# Patient Record
Sex: Female | Born: 1965 | Race: Black or African American | Hispanic: No | Marital: Single | Smoking: Never smoker
Health system: Southern US, Community
[De-identification: ages and names within clinical notes are randomized; demographics above are authoritative.]

## PROBLEM LIST (undated history)

## (undated) ENCOUNTER — Emergency Department: Admission: EM | Payer: Self-pay | Source: Home / Self Care

## (undated) DIAGNOSIS — K219 Gastro-esophageal reflux disease without esophagitis: Secondary | ICD-10-CM

## (undated) DIAGNOSIS — M419 Scoliosis, unspecified: Secondary | ICD-10-CM

## (undated) HISTORY — DX: Gastro-esophageal reflux disease without esophagitis: K21.9

## (undated) HISTORY — DX: Scoliosis, unspecified: M41.9

## (undated) HISTORY — PX: CYST EXCISION: SHX5701

---

## 2021-04-02 ENCOUNTER — Other Ambulatory Visit: Payer: Self-pay

## 2021-04-02 ENCOUNTER — Encounter: Payer: Self-pay | Admitting: Internal Medicine

## 2021-04-02 ENCOUNTER — Ambulatory Visit (INDEPENDENT_AMBULATORY_CARE_PROVIDER_SITE_OTHER): Payer: Self-pay | Admitting: Internal Medicine

## 2021-04-02 VITALS — BP 128/81 | HR 54 | Temp 99.0°F | Ht 60.24 in | Wt 124.0 lb

## 2021-04-02 DIAGNOSIS — R001 Bradycardia, unspecified: Secondary | ICD-10-CM

## 2021-04-02 DIAGNOSIS — M1990 Unspecified osteoarthritis, unspecified site: Secondary | ICD-10-CM

## 2021-04-02 DIAGNOSIS — Z124 Encounter for screening for malignant neoplasm of cervix: Secondary | ICD-10-CM | POA: Insufficient documentation

## 2021-04-02 DIAGNOSIS — R079 Chest pain, unspecified: Secondary | ICD-10-CM

## 2021-04-02 DIAGNOSIS — R000 Tachycardia, unspecified: Secondary | ICD-10-CM

## 2021-04-02 DIAGNOSIS — Z13220 Encounter for screening for lipoid disorders: Secondary | ICD-10-CM

## 2021-04-02 DIAGNOSIS — K2100 Gastro-esophageal reflux disease with esophagitis, without bleeding: Secondary | ICD-10-CM

## 2021-04-02 DIAGNOSIS — Z1231 Encounter for screening mammogram for malignant neoplasm of breast: Secondary | ICD-10-CM

## 2021-04-02 DIAGNOSIS — Z1211 Encounter for screening for malignant neoplasm of colon: Secondary | ICD-10-CM

## 2021-04-02 DIAGNOSIS — Z13820 Encounter for screening for osteoporosis: Secondary | ICD-10-CM

## 2021-04-02 MED ORDER — OMEPRAZOLE 20 MG PO CPDR: 20.0000 mg | DELAYED_RELEASE_CAPSULE | Freq: Every day | ORAL | 3 refills | Status: DC

## 2021-04-02 MED ORDER — NAPROXEN 500 MG PO TABS: 500.0000 mg | ORAL_TABLET | Freq: Two times a day (BID) | ORAL | 1 refills | Status: DC

## 2021-04-02 NOTE — Progress Notes (Signed)
BP 128/81   Pulse (!) 54   Temp 99 F (37.2 C) (Oral)   Ht 5' 0.24" (1.53 m)   Wt 124 lb (56.2 kg)   LMP  (LMP Unknown)   SpO2 98%   BMI 24.03 kg/m    Subjective:    Patient ID: April Mosley, female    DOB: 03-28-1966, 55 y.o.   MRN: [id number]  Chief Complaint  Patient presents with  . New Patient (Initial Visit)    Patient son Rulon Eisenmenger states his mother is having B/L hand pain that keeps her up at night. She has a loss of appetite and struggles to eat, and also that when she get upset about something that her mid chest hurts, but not heart pain. Patient just moved her from [place] in April.    HPI: April Mosley is a 55 y.o. female  Pt is here to establish care, she moved here from [place], her son is with her, he is the interpreter. Never seen pcp in [place].   Gastroesophageal Reflux She complains of heartburn. She reports no abdominal pain, no belching, no chest pain, no choking, no coughing, no dysphagia, no early satiety, no globus sensation, no hoarse voice, no nausea, no sore throat, no stridor, no tooth decay, no water brash or no wheezing. pt had some stress at home  ,has had heartburnhas been taking advil for her hand pain. Chronicity: denies melena, no hematochezia.  Hand Pain  The incident occurred more than 1 week ago (< 1 hour in the morning , has morning stiffness in bil hands. no swelling). There was no injury mechanism. The quality of the pain is described as aching. The pain is at a severity of 4/10. The pain has been Intermittent since the incident. Pertinent negatives include no chest pain. She has tried NSAIDs for the symptoms. The treatment provided moderate relief.   Chief Complaint  Patient presents with  . New Patient (Initial Visit)    Patient son Rulon Eisenmenger states his mother is having B/L hand pain that keeps her up at night. She has a loss of appetite and struggles to eat, and also that when she get upset about something that her mid chest hurts, but not heart pain.  Patient just moved her from [place] in April.    Relevant past medical, surgical, family and social history reviewed and updated as indicated. Interim medical history since our last visit reviewed. Allergies and medications reviewed and updated.  Review of Systems  HENT:  Negative for hoarse voice and sore throat.   Respiratory:  Negative for cough, choking and wheezing.   Cardiovascular:  Negative for chest pain.  Gastrointestinal:  Positive for heartburn. Negative for abdominal pain, dysphagia and nausea.   Per HPI unless specifically indicated above     Objective:    BP 128/81   Pulse (!) 54   Temp 99 F (37.2 C) (Oral)   Ht 5' 0.24" (1.53 m)   Wt 124 lb (56.2 kg)   LMP  (LMP Unknown)   SpO2 98%   BMI 24.03 kg/m   Wt Readings from Last 3 Encounters:  04/02/21 124 lb (56.2 kg)    Physical Exam Vitals and nursing note reviewed.  Constitutional:      General: She is not in acute distress.    Appearance: Normal appearance. She is not ill-appearing or diaphoretic.  Eyes:     Conjunctiva/sclera: Conjunctivae normal.  Cardiovascular:     Rate and Rhythm: Regular rhythm. Bradycardia present.  Heart sounds: No murmur heard.   No friction rub. No gallop.  Pulmonary:     Effort: No respiratory distress.     Breath sounds: No stridor. No wheezing or rhonchi.  Abdominal:     General: Abdomen is flat. Bowel sounds are normal. There is no distension.     Palpations: Abdomen is soft. There is no mass.     Tenderness: There is no abdominal tenderness. There is no guarding.  Musculoskeletal:        General: No swelling, tenderness, deformity or signs of injury.  Skin:    General: Skin is warm and dry.     Coloration: Skin is not jaundiced or pale.     Findings: No bruising or erythema.  Neurological:     Mental Status: She is alert.  Psychiatric:        Mood and Affect: Mood normal.        Behavior: Behavior normal.        Thought Content: Thought content normal.   No  results found for this or any previous visit.      Current Outpatient Medications:  .  omeprazole (PRILOSEC) 20 MG capsule, Take 1 capsule (20 mg total) by mouth daily., Disp: 30 capsule, Rfl: 3    Assessment & Plan:  GERD : start pt on a PPI  patient advised to avoid laying down soon after his meals. He took a 2 hours between dinner and bedtime. Avoid spicy food and triggers that he knows food wise that worsen his acid reflux. Patient verbalized understanding of the above. Lifestyle modifications as above discussed with patient.   2. Bradcardia ? Sec to heart block vs CAD given non specific ST - T changes inleads V1 - v4  Check TSH / Ft4 as pt has bradycardia.   3. Hand pain will check labs including arthritis wu ? sec RA vs Gout CHeck RF/ ANA / uric acid.  check xrays of swollen joints. consider referral to rheumatology pt takes pain meds for such avodi NSAIDs given chest pain      Problem List Items Addressed This Visit       Digestive   Gastroesophageal reflux disease with esophagitis     Musculoskeletal and Integument   Arthritis - Primary   Relevant Orders   CBC with Differential/Platelet   Comprehensive metabolic panel   Rheumatoid factor   ANA   Uric acid   TSH   DG Hand Complete Left   DG Hand Complete Right     Other   Screen for colon cancer   Relevant Orders   Ambulatory referral to Gastroenterology   Encounter for screening mammogram for malignant neoplasm of breast   Relevant Orders   MM 3D SCREEN BREAST BILATERAL   Screening for cervical cancer   Relevant Orders   Ambulatory referral to Obstetrics / Gynecology   Screening for osteoporosis   Relevant Orders   DG Bone Density   Screening for lipid disorders   Relevant Orders   Lipid panel   Bradycardia   Relevant Orders   EKG 12-Lead (Completed)   Ambulatory referral to Cardiology   Chest pain   Relevant Orders   Ambulatory referral to Cardiology   Other Visit Diagnoses     Tachycardia             Orders Placed This Encounter  Procedures  . MM 3D SCREEN BREAST BILATERAL  . DG Bone Density  . DG Hand Complete Left  . DG Hand  Complete Right  . CBC with Differential/Platelet  . Comprehensive metabolic panel  . Rheumatoid factor  . ANA  . Uric acid  . TSH  . Lipid panel  . Ambulatory referral to Gastroenterology  . Ambulatory referral to Obstetrics / Gynecology  . Ambulatory referral to Cardiology  . EKG 12-Lead     Meds ordered this encounter  Medications  . DISCONTD: naproxen (NAPROSYN) 500 MG tablet    Sig: Take 1 tablet (500 mg total) by mouth 2 (two) times daily with a meal.    Dispense:  60 tablet    Refill:  1  . omeprazole (PRILOSEC) 20 MG capsule    Sig: Take 1 capsule (20 mg total) by mouth daily.    Dispense:  30 capsule    Refill:  3     Follow up plan: Return in about 2 weeks (around 04/16/2021).  Health Maintenance : Mammogram: never  , ordered today   Paps smear:never  , ordered today  DEXA:never  , ordered today  Cscope :never  , ordered today

## 2021-04-02 NOTE — Patient Instructions (Signed)
geGastroesophageal Reflux Disease, Adult Gastroesophageal reflux (GER) happens when acid from the stomach flows up into the tube that connects the mouth and the stomach (esophagus). Normally, food travels down the esophagus and stays in the stomach to be digested. However, when a person has GER, food and stomach acid sometimes move back up into the esophagus. If this becomes a more serious problem, the person may be diagnosed with a disease called gastroesophageal reflux disease (GERD). GERD occurs when the reflux: Happens often. Causes frequent or severe symptoms. Causes problems such as damage to the esophagus. When stomach acid comes in contact with the esophagus, the acid may cause inflammation in the esophagus. Over time, GERD may create small holes (ulcers) in the lining of the esophagus. What are the causes? This condition is caused by a problem with the muscle between the esophagus and the stomach (lower esophageal sphincter, or LES). Normally, the LES muscle closes after food passes through the esophagus to the stomach. When the LES is weakened or abnormal, it does not close properly, and that allows food and stomach acid to go back up into the esophagus. The LES can be weakened by certain dietary substances, medicines, and medical conditions, including: Tobacco use. Pregnancy. Having a hiatal hernia. Alcohol use. Certain foods and beverages, such as coffee, chocolate, onions, and peppermint. What increases the risk? You are more likely to develop this condition if you: Have an increased body weight. Have a connective tissue disorder. Take NSAIDs, such as ibuprofen. What are the signs or symptoms? Symptoms of this condition include: Heartburn. Difficult or painful swallowing and the feeling of having a lump in the throat. A bitter taste in the mouth. Bad breath and having a large amount of saliva. Having an upset or bloated stomach and belching. Chest pain. Different conditions can  cause chest pain. Make sure you see your health care provider if you experience chest pain. Shortness of breath or wheezing. Ongoing (chronic) cough or a nighttime cough. Wearing away of tooth enamel. Weight loss. How is this diagnosed? This condition may be diagnosed based on a medical history and a physical exam. To determine if you have mild or severe GERD, your health care provider may also monitor how you respond to treatment. You may also have tests, including: A test to examine your stomach and esophagus with a small camera (endoscopy). A test that measures the acidity level in your esophagus. A test that measures how much pressure is on your esophagus. A barium swallow or modified barium swallow test to show the shape, size, and functioning of your esophagus. How is this treated? Treatment for this condition may vary depending on how severe your symptoms are. Your health care provider may recommend: Changes to your diet. Medicine. Surgery. The goal of treatment is to help relieve your symptoms and to prevent complications. Follow these instructions at home: Eating and drinking  Follow a diet as recommended by your health care provider. This may involve avoiding foods and drinks such as: Coffee and tea, with or without caffeine. Drinks that contain alcohol. Energy drinks and sports drinks. Carbonated drinks or sodas. Chocolate and cocoa. Peppermint and mint flavorings. Garlic and onions. Horseradish. Spicy and acidic foods, including peppers, chili powder, curry powder, vinegar, hot sauces, and barbecue sauce. Citrus fruit juices and citrus fruits, such as oranges, lemons, and limes. Tomato-based foods, such as red sauce, chili, salsa, and pizza with red sauce. Fried and fatty foods, such as donuts, french fries, potato chips, and high-fat dressings.  High-fat meats, such as hot dogs and fatty cuts of red and white meats, such as rib eye steak, sausage, ham, and  bacon. High-fat dairy items, such as whole milk, butter, and cream cheese. Eat small, frequent meals instead of large meals. Avoid drinking large amounts of liquid with your meals. Avoid eating meals during the 2-3 hours before bedtime. Avoid lying down right after you eat. Do not exercise right after you eat. Lifestyle  Do not use any products that contain nicotine or tobacco. These products include cigarettes, chewing tobacco, and vaping devices, such as e-cigarettes. If you need help quitting, ask your health care provider. Try to reduce your stress by using methods such as yoga or meditation. If you need help reducing stress, ask your health care provider. If you are overweight, reduce your weight to an amount that is healthy for you. Ask your health care provider for guidance about a safe weight loss goal. General instructions Pay attention to any changes in your symptoms. Take over-the-counter and prescription medicines only as told by your health care provider. Do not take aspirin, ibuprofen, or other NSAIDs unless your health care provider told you to take these medicines. Wear loose-fitting clothing. Do not wear anything tight around your waist that causes pressure on your abdomen. Raise (elevate) the head of your bed about 6 inches (15 cm). You can use a wedge to do this. Avoid bending over if this makes your symptoms worse. Keep all follow-up visits. This is important. Contact a health care provider if: You have: New symptoms. Unexplained weight loss. Difficulty swallowing or it hurts to swallow. Wheezing or a persistent cough. A hoarse voice. Your symptoms do not improve with treatment. Get help right away if: You have sudden pain in your arms, neck, jaw, teeth, or back. You suddenly feel sweaty, dizzy, or light-headed. You have chest pain or shortness of breath. You vomit and the vomit is green, yellow, or black, or it looks like blood or coffee grounds. You faint. You  have stool that is red, bloody, or black. You cannot swallow, drink, or eat. These symptoms may represent a serious problem that is an emergency. Do not wait to see if the symptoms will go away. Get medical help right away. Call your local emergency services (911 in the [place] Do not drive yourself to the hospital. Summary Gastroesophageal reflux happens when acid from the stomach flows up into the esophagus. GERD is a disease in which the reflux happens often, causes frequent or severe symptoms, or causes problems such as damage to the esophagus. Treatment for this condition may vary depending on how severe your symptoms are. Your health care provider may recommend diet and lifestyle changes, medicine, or surgery. Contact a health care provider if you have new or worsening symptoms. Take over-the-counter and prescription medicines only as told by your health care provider. Do not take aspirin, ibuprofen, or other NSAIDs unless your health care provider told you to do so. Keep all follow-up visits as told by your health care provider. This is important. This information is not intended to replace advice given to you by your health care provider. Make sure you discuss any questions you have with your health care provider. Document Revised: 11/26/2019 Document Reviewed: 11/26/2019 Elsevier Patient Education  2022 [employer].

## 2021-04-04 LAB — COMPREHENSIVE METABOLIC PANEL
ALT: 16 IU/L (ref 0–32)
AST: 23 IU/L (ref 0–40)
Albumin/Globulin Ratio: 1.5 (ref 1.2–2.2)
Albumin: 4.7 g/dL (ref 3.8–4.9)
Alkaline Phosphatase: 90 IU/L (ref 44–121)
BUN/Creatinine Ratio: 32 — ABNORMAL HIGH (ref 9–23)
BUN: 16 mg/dL (ref 6–24)
Bilirubin Total: 0.6 mg/dL (ref 0.0–1.2)
CO2: 21 mmol/L (ref 20–29)
Calcium: 9.9 mg/dL (ref 8.7–10.2)
Chloride: 98 mmol/L (ref 96–106)
Creatinine, Ser: 0.5 mg/dL — ABNORMAL LOW (ref 0.57–1.00)
Globulin, Total: 3.2 g/dL (ref 1.5–4.5)
Glucose: 72 mg/dL (ref 70–99)
Potassium: 4.3 mmol/L (ref 3.5–5.2)
Sodium: 139 mmol/L (ref 134–144)
Total Protein: 7.9 g/dL (ref 6.0–8.5)
eGFR: 110 mL/min/{1.73_m2} (ref >59)

## 2021-04-04 LAB — CBC WITH DIFFERENTIAL/PLATELET
Basophils Absolute: 0 10*3/uL (ref 0.0–0.2)
Basos: 0 %
EOS (ABSOLUTE): 0.1 10*3/uL (ref 0.0–0.4)
Eos: 2 %
Hematocrit: 41.9 % (ref 34.0–46.6)
Hemoglobin: 13.4 g/dL (ref 11.1–15.9)
Immature Grans (Abs): 0 10*3/uL (ref 0.0–0.1)
Immature Granulocytes: 0 %
Lymphocytes Absolute: 2.1 10*3/uL (ref 0.7–3.1)
Lymphs: 41 %
MCH: 27.8 pg (ref 26.6–33.0)
MCHC: 32 g/dL (ref 31.5–35.7)
MCV: 87 fL (ref 79–97)
Monocytes Absolute: 0.5 10*3/uL (ref 0.1–0.9)
Monocytes: 10 %
Neutrophils Absolute: 2.4 10*3/uL (ref 1.4–7.0)
Neutrophils: 47 %
Platelets: 234 10*3/uL (ref 150–450)
RBC: 4.82 x10E6/uL (ref 3.77–5.28)
RDW: 13.1 % (ref 11.7–15.4)
WBC: 5.1 10*3/uL (ref 3.4–10.8)

## 2021-04-04 LAB — TSH: TSH: 1.35 u[IU]/mL (ref 0.450–4.500)

## 2021-04-04 LAB — ANA: Anti Nuclear Antibody (ANA): POSITIVE — AB

## 2021-04-04 LAB — URIC ACID: Uric Acid: 3.5 mg/dL (ref 3.0–7.2)

## 2021-04-04 LAB — RHEUMATOID FACTOR: Rheumatoid fact SerPl-aCnc: 11.9 IU/mL (ref <14.0)

## 2021-04-06 ENCOUNTER — Ambulatory Visit
Admission: RE | Admit: 2021-04-06 | Discharge: 2021-04-06 | Disposition: A | Discharge status: Home / Self Care | Payer: Self-pay | Source: Ambulatory Visit | Attending: Internal Medicine | Admitting: Internal Medicine

## 2021-04-06 ENCOUNTER — Other Ambulatory Visit: Payer: Self-pay

## 2021-04-06 ENCOUNTER — Ambulatory Visit
Admission: RE | Admit: 2021-04-06 | Discharge: 2021-04-06 | Disposition: A | Discharge status: Home / Self Care | Payer: Self-pay | Attending: Internal Medicine | Admitting: Internal Medicine

## 2021-04-06 DIAGNOSIS — M1990 Unspecified osteoarthritis, unspecified site: Secondary | ICD-10-CM

## 2021-04-06 NOTE — Progress Notes (Signed)
Called patient's son regarding abnl labs, he is his who is the patient's POA and interpreter secondary to patient not speaking [place].  Given information on additional labs that will be ordered when she returns and the need for x-rays given a positive ANA. Pt hasnt had xrays of her hands yet,  We will check dsDNA and most likely needs to see rheumatology for further work-up and management.

## 2021-04-06 NOTE — Addendum Note (Signed)
Addended by: Loura Pardon on: 04/06/2021 11:10 AM   Modules accepted: Orders

## 2021-04-08 ENCOUNTER — Other Ambulatory Visit: Payer: Self-pay

## 2021-04-08 DIAGNOSIS — Z1211 Encounter for screening for malignant neoplasm of colon: Secondary | ICD-10-CM

## 2021-04-08 MED ORDER — PEG 3350-KCL-NA BICARB-NACL 420 G PO SOLR: 4000.0000 mL | Freq: Once | ORAL | 0 refills | Status: AC

## 2021-04-08 NOTE — Progress Notes (Signed)
Gastroenterology Pre-Procedure Review  Request Date: 04/28/21 Requesting Physician: Dr. Tobi Bastos  PATIENT REVIEW QUESTIONS: The patient responded to the following health history questions as indicated:    1. Are you having any GI issues? no 2. Do you have a personal history of Polyps? no 3. Do you have a family history of Colon Cancer or Polyps? no 4. Diabetes Mellitus? no 5. Joint replacements in the past 12 months?no 6. Major health problems in the past 3 months?no 7. Any artificial heart valves, MVP, or defibrillator?no    MEDICATIONS & ALLERGIES:    Patient reports the following regarding taking any anticoagulation/antiplatelet therapy:   Plavix, Coumadin, Eliquis, Xarelto, Lovenox, Pradaxa, Brilinta, or Effient? no Aspirin? no  Patient confirms/reports the following medications:  Current Outpatient Medications  Medication Sig Dispense Refill   omeprazole (PRILOSEC) 20 MG capsule Take 1 capsule (20 mg total) by mouth daily. 30 capsule 3   No current facility-administered medications for this visit.    Patient confirms/reports the following allergies:  No Known Allergies  No orders of the defined types were placed in this encounter.   AUTHORIZATION INFORMATION Primary Insurance: 1D#: Group #:  Secondary Insurance: 1D#: Group #:  SCHEDULE INFORMATION: Date: 04/28/21 Time: Location: [facility]

## 2021-04-09 ENCOUNTER — Encounter: Payer: Self-pay | Admitting: Internal Medicine

## 2021-04-09 ENCOUNTER — Other Ambulatory Visit: Payer: Self-pay

## 2021-04-09 ENCOUNTER — Ambulatory Visit (INDEPENDENT_AMBULATORY_CARE_PROVIDER_SITE_OTHER): Payer: Self-pay | Admitting: Internal Medicine

## 2021-04-09 VITALS — BP 120/80 | HR 60 | Ht 60.0 in | Wt 127.0 lb

## 2021-04-09 DIAGNOSIS — R001 Bradycardia, unspecified: Secondary | ICD-10-CM

## 2021-04-09 DIAGNOSIS — R079 Chest pain, unspecified: Secondary | ICD-10-CM

## 2021-04-09 DIAGNOSIS — R0789 Other chest pain: Secondary | ICD-10-CM

## 2021-04-09 DIAGNOSIS — R002 Palpitations: Secondary | ICD-10-CM

## 2021-04-09 NOTE — Progress Notes (Signed)
New Outpatient Visit Date: 04/09/2021  Referring Provider: Loura Pardon, MD [address] [employer] [place],  [place] [address]  Chief Complaint: Chest pain  HPI:  April Mosley is a 55 y.o. female who is being seen today for the evaluation of bradycardia and chest pain at the request of Dr. Charlotta Newton. History is obtained with assistance of [place] interpreter via telephone. She has no significant past medical history.  He moved here from [place] 6-7 months ago.  She reports a long history of intermittent chest pain and palpitations that predate her move to [place].  She states that these symptoms usually occur when she is stressed, particularly when dealing with her daughter.  Episodes last a few minutes and resolve spontaneously.  She cannot characterize the pain further.  It is substernal without radiation.  She denies shortness of breath, lightheadedness, nausea, and diaphoresis.  She does not have any exertional symptoms.  She denies a history of heart disease and prior heart testing.  --------------------------------------------------------------------------------------------------  Cardiovascular History & Procedures: Cardiovascular Problems: Chest pain Palpitations Bradycardia  Risk Factors: None  Cath/PCI: None  CV Surgery: None  EP Procedures and Devices: None  Non-Invasive Evaluation(s): None  Recent CV Pertinent Labs: Lab Results  Component Value Date   K 4.3 04/02/2021   BUN 16 04/02/2021   CREATININE 0.50 (L) 04/02/2021    --------------------------------------------------------------------------------------------------  Past Medical History: None  Past Surgical History: None  Medications: None  Allergies: Patient has no known allergies.  Social History   Tobacco Use   Smoking status: Never   Smokeless tobacco: Never  Vaping Use   Vaping Use: Never used  Substance Use Topics   Alcohol use: Not Currently   Drug use: Never    Family History: She reports her parents and  siblings do not have any medical problems.  She specifically denies a family history of heart disease, hypertension, hyperlipidemia, diabetes, and cancer.  Review of Systems: A 12-system review of systems was performed and was negative except as noted in the HPI.  --------------------------------------------------------------------------------------------------  Physical Exam: BP 120/80 (BP Location: Right Arm, Patient Position: Sitting, Cuff Size: Normal)   Pulse 60   Ht 5' (1.524 m)   Wt 127 lb (57.6 kg)   LMP  (LMP Unknown)   SpO2 98%   BMI 24.80 kg/m   General:  NAD.  Accompanied by her daughter-in-law HEENT: No conjunctival pallor or scleral icterus. Facemask in place. Neck: Supple without lymphadenopathy, thyromegaly, JVD, or HJR. No carotid bruit. Lungs: Normal work of breathing. Clear to auscultation bilaterally without wheezes or crackles. Heart: Regular rate and rhythm without murmurs, rubs, or gallops. Non-displaced PMI. Abd: Bowel sounds present. Soft, NT/ND without hepatosplenomegaly Ext: No lower extremity edema. Radial, PT, and DP pulses are 2+ bilaterally Skin: Warm and dry without rash. Neuro: CNIII-XII intact. Strength and fine-touch sensation intact in upper and lower extremities bilaterally. Psych: Normal mood and affect.  EKG:  Normal sinus rhythm without abnormality.  Lab Results  Component Value Date   WBC 5.1 04/02/2021   HGB 13.4 04/02/2021   HCT 41.9 04/02/2021   MCV 87 04/02/2021   PLT 234 04/02/2021    Lab Results  Component Value Date   NA 139 04/02/2021   K 4.3 04/02/2021   CL 98 04/02/2021   CO2 21 04/02/2021   BUN 16 04/02/2021   CREATININE 0.50 (L) 04/02/2021   GLUCOSE 72 04/02/2021   ALT 16 04/02/2021   Lab Results  Component Value Date   TSH 1.350  04/02/2021    --------------------------------------------------------------------------------------------------  ASSESSMENT AND PLAN: Atypical chest pain and  palpitations: Symptoms only seem to occur during stressful situations, particularly when April Mosley is interacting with her daughter.  This is most consistent with anxiety, particularly given absence of exertional symptoms as well as cardiac risk factors.  We discussed the role for noninvasive ischemia testing and ambulatory cardiac monitoring but have agreed to defer this, as April Mosley is hopeful that her stress level will improve over the next month or two.  It would be worthwhile to obtain a fasting lipid panel with her next blood draw for further risk stratification.  Bradycardia: Recent EKG by Dr. Charlotta Newton showed sinus bradycardia.  Today's tracing is normal with a heart rate of 60 bpm.  Recent TSH was normal.  No further workup is recommended at this time.  Follow-up: Return to clinic in 2 months.  Yvonne Kendall, MD 04/10/2021 7:46 PM

## 2021-04-09 NOTE — Patient Instructions (Addendum)
Medication Instructions:   Your physician recommends that you continue on your current medications as directed. Please refer to the Current Medication list given to you today.  *If you need a refill on your cardiac medications before your next appointment, please call your pharmacy*   Lab Work:  None ordered  Testing/Procedures:  None ordered   Follow-Up: At [facility], you and your health needs are our priority.  As part of our continuing mission to provide you with exceptional heart care, we have created designated Provider Care Teams.  These Care Teams include your primary Cardiologist (physician) and Advanced Practice Providers (APPs -  Physician Assistants and Nurse Practitioners) who all work together to provide you with the care you need, when you need it.  We recommend signing up for the patient portal called "MyChart".  Sign up information is provided on this After Visit Summary.  MyChart is used to connect with patients for Virtual Visits (Telemedicine).  Patients are able to view lab/test results, encounter notes, upcoming appointments, etc.  Non-urgent messages can be sent to your provider as well.   To learn more about what you can do with MyChart, go to [email or web address].    Your next appointment:   2 month(s)  The format for your next appointment:   In Person  Provider:   You may see Dr. Okey Dupre or one of the following Advanced Practice Providers on your designated Care Team:   Nicolasa Ducking, NP Eula Listen, PA-C Cadence Fransico Michael, PA-C  Other instructions  Contact our office if you have any recurrent chest pain or if you choose to move forward with further testing

## 2021-04-10 ENCOUNTER — Encounter: Payer: Self-pay | Admitting: Internal Medicine

## 2021-04-10 DIAGNOSIS — R002 Palpitations: Secondary | ICD-10-CM | POA: Insufficient documentation

## 2021-04-17 ENCOUNTER — Other Ambulatory Visit: Payer: Self-pay

## 2021-04-17 ENCOUNTER — Ambulatory Visit (INDEPENDENT_AMBULATORY_CARE_PROVIDER_SITE_OTHER): Payer: Self-pay | Admitting: Internal Medicine

## 2021-04-17 ENCOUNTER — Encounter: Payer: Self-pay | Admitting: Internal Medicine

## 2021-04-17 VITALS — BP 134/82 | HR 51 | Temp 98.2°F | Ht 59.84 in | Wt 129.0 lb

## 2021-04-17 DIAGNOSIS — Z13220 Encounter for screening for lipoid disorders: Secondary | ICD-10-CM

## 2021-04-17 DIAGNOSIS — R001 Bradycardia, unspecified: Secondary | ICD-10-CM

## 2021-04-17 DIAGNOSIS — Z1231 Encounter for screening mammogram for malignant neoplasm of breast: Secondary | ICD-10-CM

## 2021-04-17 DIAGNOSIS — R768 Other specified abnormal immunological findings in serum: Secondary | ICD-10-CM

## 2021-04-17 DIAGNOSIS — M1990 Unspecified osteoarthritis, unspecified site: Secondary | ICD-10-CM

## 2021-04-17 DIAGNOSIS — Z13820 Encounter for screening for osteoporosis: Secondary | ICD-10-CM

## 2021-04-17 LAB — URINALYSIS, ROUTINE W REFLEX MICROSCOPIC
Bilirubin, UA: NEGATIVE
Glucose, UA: NEGATIVE
Ketones, UA: NEGATIVE
Leukocytes,UA: NEGATIVE
Nitrite, UA: NEGATIVE
Protein,UA: NEGATIVE
RBC, UA: NEGATIVE
Specific Gravity, UA: 1.01 (ref 1.005–1.030)
Urobilinogen, Ur: 0.2 mg/dL (ref 0.2–1.0)
pH, UA: 6 (ref 5.0–7.5)

## 2021-04-17 NOTE — Patient Instructions (Signed)
Please call to schedule your mammogram and/or bone density: °[facility]  °Address: [address], , [place] [address]  °Phone: [phone] ° °

## 2021-04-17 NOTE — Progress Notes (Signed)
BP 134/82   Pulse (!) 51   Temp 98.2 F (36.8 C) (Oral)   Ht 4' 11.84" (1.52 m)   Wt 129 lb (58.5 kg)   LMP  (LMP Unknown)   SpO2 98%   BMI 25.33 kg/m    Subjective:    Patient ID: April Mosley, female    DOB: 21-Nov-1965, 55 y.o.   MRN: [id number]  Chief Complaint  Patient presents with   arthritis    HPI: April Mosley is a 55 y.o. female  Pt is here for a follow up she has a +Ve ANA , no ho blood clots, no misscarriages, no ho Kidney, no malar rash, no lung issues- no SOB/ No cough, no abdominal pain   Arthritis Presents for follow-up visit. She reports no pain, stiffness, joint swelling or joint warmth. (Hnads in wrists and palms of the bil hands. She dneies pain in other big joints. )   Chief Complaint  Patient presents with   arthritis    Relevant past medical, surgical, family and social history reviewed and updated as indicated. Interim medical history since our last visit reviewed. Allergies and medications reviewed and updated.  Review of Systems  Musculoskeletal:  Positive for arthritis. Negative for joint swelling and stiffness.   Per HPI unless specifically indicated above     Objective:    BP 134/82   Pulse (!) 51   Temp 98.2 F (36.8 C) (Oral)   Ht 4' 11.84" (1.52 m)   Wt 129 lb (58.5 kg)   LMP  (LMP Unknown)   SpO2 98%   BMI 25.33 kg/m   Wt Readings from Last 3 Encounters:  04/17/21 129 lb (58.5 kg)  04/09/21 127 lb (57.6 kg)  04/02/21 124 lb (56.2 kg)    Physical Exam Vitals and nursing note reviewed.  Constitutional:      General: She is not in acute distress.    Appearance: Normal appearance. She is not ill-appearing or diaphoretic.  Eyes:     Conjunctiva/sclera: Conjunctivae normal.  Pulmonary:     Breath sounds: No rhonchi.  Abdominal:     General: Abdomen is flat. Bowel sounds are normal. There is no distension.     Palpations: Abdomen is soft. There is no mass.     Tenderness: There is no abdominal tenderness. There is no  guarding.  Skin:    General: Skin is warm and dry.     Coloration: Skin is not jaundiced.     Findings: No erythema.  Neurological:     Mental Status: She is alert.    Results for orders placed or performed in visit on 04/02/21  CBC with Differential/Platelet  Result Value Ref Range   WBC 5.1 3.4 - 10.8 x10E3/uL   RBC 4.82 3.77 - 5.28 x10E6/uL   Hemoglobin 13.4 11.1 - 15.9 g/dL   Hematocrit 41.9 34.0 - 46.6 %   MCV 87 79 - 97 fL   MCH 27.8 26.6 - 33.0 pg   MCHC 32.0 31.5 - 35.7 g/dL   RDW 13.1 11.7 - 15.4 %   Platelets 234 150 - 450 x10E3/uL   Neutrophils 47 Not Estab. %   Lymphs 41 Not Estab. %   Monocytes 10 Not Estab. %   Eos 2 Not Estab. %   Basos 0 Not Estab. %   Neutrophils Absolute 2.4 1.4 - 7.0 x10E3/uL   Lymphocytes Absolute 2.1 0.7 - 3.1 x10E3/uL   Monocytes Absolute 0.5 0.1 - 0.9 x10E3/uL   EOS (ABSOLUTE)  0.1 0.0 - 0.4 x10E3/uL   Basophils Absolute 0.0 0.0 - 0.2 x10E3/uL   Immature Granulocytes 0 Not Estab. %   Immature Grans (Abs) 0.0 0.0 - 0.1 x10E3/uL  Comprehensive metabolic panel  Result Value Ref Range   Glucose 72 70 - 99 mg/dL   BUN 16 6 - 24 mg/dL   Creatinine, Ser 0.50 (L) 0.57 - 1.00 mg/dL   eGFR 110 >59 mL/min/1.73   BUN/Creatinine Ratio 32 (H) 9 - 23   Sodium 139 134 - 144 mmol/L   Potassium 4.3 3.5 - 5.2 mmol/L   Chloride 98 96 - 106 mmol/L   CO2 21 20 - 29 mmol/L   Calcium 9.9 8.7 - 10.2 mg/dL   Total Protein 7.9 6.0 - 8.5 g/dL   Albumin 4.7 3.8 - 4.9 g/dL   Globulin, Total 3.2 1.5 - 4.5 g/dL   Albumin/Globulin Ratio 1.5 1.2 - 2.2   Bilirubin Total 0.6 0.0 - 1.2 mg/dL   Alkaline Phosphatase 90 44 - 121 IU/L   AST 23 0 - 40 IU/L   ALT 16 0 - 32 IU/L  Rheumatoid factor  Result Value Ref Range   Rhuematoid fact SerPl-aCnc 11.9 <14.0 IU/mL  ANA  Result Value Ref Range   Anti Nuclear Antibody (ANA) Positive (A) Negative  Uric acid  Result Value Ref Range   Uric Acid 3.5 3.0 - 7.2 mg/dL  TSH  Result Value Ref Range   TSH 1.350 0.450  - 4.500 uIU/mL       No current outpatient medications on file.    Assessment & Plan:   1/ Positive ANA will check confirmatory tests for ? Lupus Will refer to rheumatology  Xryas -ve no acute inflammation noted   2. Cscope to be perfrmed end of the month  3. Atypical chest pain : sees cards for such  To fu in 2 months for a revisit, ? Has anxiety recently moved from [place], to clal cards if recurs.  Has been placed on Ppis for ? GERD  4. GERD stable. Continue Ppis for such   Problem List Items Addressed This Visit   None    Orders Placed This Encounter  Procedures   ANA+ENA+DNA/DS+Antich+Centr   Urinalysis, Routine w reflex microscopic   Ambulatory referral to Rheumatology     No orders of the defined types were placed in this encounter.    Follow up plan: No follow-ups on file.

## 2021-04-20 ENCOUNTER — Telehealth: Payer: Self-pay | Admitting: Gastroenterology

## 2021-04-20 NOTE — Telephone Encounter (Signed)
Patient wants to cancel procedure. Clinical staff will follow up with patient. 

## 2021-04-22 LAB — ANA+ENA+DNA/DS+ANTICH+CENTR
ANA Titer 1: NEGATIVE
Anti JO-1: <0.2 AI (ref 0.0–0.9)
Centromere Ab Screen: <0.2 AI (ref 0.0–0.9)
Chromatin Ab SerPl-aCnc: 0.5 AI (ref 0.0–0.9)
ENA RNP Ab: 0.6 AI (ref 0.0–0.9)
ENA SM Ab Ser-aCnc: <0.2 AI (ref 0.0–0.9)
ENA SSA (RO) Ab: <0.2 AI (ref 0.0–0.9)
ENA SSB (LA) Ab: <0.2 AI (ref 0.0–0.9)
Scleroderma (Scl-70) (ENA) Antibody, IgG: <0.2 AI (ref 0.0–0.9)
dsDNA Ab: 1 IU/mL (ref 0–9)

## 2021-04-28 ENCOUNTER — Ambulatory Visit: Admission: RE | Admit: 2021-04-28 | Payer: Self-pay | Source: Home / Self Care | Admitting: Gastroenterology

## 2021-04-28 ENCOUNTER — Encounter: Admission: RE | Payer: Self-pay | Source: Home / Self Care

## 2021-04-28 SURGERY — COLONOSCOPY WITH PROPOFOL
Anesthesia: General

## 2021-06-19 ENCOUNTER — Encounter: Payer: Self-pay | Admitting: Nurse Practitioner

## 2021-06-19 ENCOUNTER — Ambulatory Visit (INDEPENDENT_AMBULATORY_CARE_PROVIDER_SITE_OTHER): Payer: Self-pay | Admitting: Nurse Practitioner

## 2021-06-19 ENCOUNTER — Other Ambulatory Visit: Payer: Self-pay

## 2021-06-19 VITALS — BP 114/78 | HR 57 | Ht 61.0 in | Wt 126.4 lb

## 2021-06-19 DIAGNOSIS — R0789 Other chest pain: Secondary | ICD-10-CM

## 2021-06-19 DIAGNOSIS — R001 Bradycardia, unspecified: Secondary | ICD-10-CM

## 2021-06-19 NOTE — Progress Notes (Signed)
Cardiology Office Note:    Date:  06/19/2021   ID:  April Mosley, DOB Jan 01, 1966, MRN [id number]  PCP:  April Pardon, MD   [facility] Providers Cardiologist:  Yvonne Kendall, MD     Referring MD: April Pardon, MD   Chief Complaint: 2 month follow-up chest pain  History of Present Illness:    April Mosley is a 56 y.o. female with a hx of GERD, bradycardia, and atypical chest pain.   She was referred by PCP for evaluation of sinus bradycardia and chest pain and evaluated by Dr. Okey Dupre on 04/09/21. She reported a long history of intermittent chest pain and palpitations that predate her move to [place]. Reported symptoms most often occurred with stress and would last a few minutes and resolve spontaneously. Her pain was substernal without radiation. No SOB, lightheadedness, nausea, and diaphoresis. Her symptoms were felt to be anxiety related and she deferred further cardiac testing. Her EKG revealed HR of 60 bpm with no ST/T wave abnormalities. She was advised to return in 2 months.   Today, she is here with her son who speaks [place] and is serving as her interpreter.  She lives with her son and his family and is not currently employed.  She denies chest pain, shortness of breath, lower extremity edema, fatigue, palpitations, melena, diaphoresis, weakness, presyncope, syncope, orthopnea, and PND.  When asked about the stressful situation that she described at previous office visit she states that that situation has improved.  Her son states it was his younger sister causing stress and she is better now.  The family goes on walks together and eats a healthy diet of mostly food native to her home country of [place].  She does not have asked specific concerns today and does not wish to pursue further testing.  Past Medical History:  Diagnosis Date   GERD (gastroesophageal reflux disease)    Scoliosis deformity of spine     Past Surgical History:  Procedure Laterality Date   CYST EXCISION Right     beneath right axilla    Current Medications: No outpatient medications have been marked as taking for the 06/19/21 encounter (Office Visit) with Levi Aland, NP.     Allergies:   Patient has no known allergies.   Social History   Socioeconomic History   Marital status: Single    Spouse name: Not on file   Number of children: Not on file   Years of education: Not on file   Highest education level: Not on file  Occupational History   Not on file  Tobacco Use   Smoking status: Never   Smokeless tobacco: Never  Vaping Use   Vaping Use: Never used  Substance and Sexual Activity   Alcohol use: Not Currently   Drug use: Never   Sexual activity: Not Currently  Other Topics Concern   Not on file  Social History Narrative   Not on file   Social Determinants of Health   Financial Resource Strain: Not on file  Food Insecurity: Not on file  Transportation Needs: Not on file  Physical Activity: Not on file  Stress: Not on file  Social Connections: Not on file     Family History: The patient's family history is negative for Heart disease.  ROS:   Please see the history of present illness. All other systems reviewed and are negative.  Labs/Other Studies Reviewed:    The following studies were reviewed today:   Recent Labs: 04/02/2021: ALT 16; BUN 16;  Creatinine, Ser 0.50; Hemoglobin 13.4; Platelets 234; Potassium 4.3; Sodium 139; TSH 1.350  Recent Lipid Panel No results found for: CHOL, TRIG, HDL, CHOLHDL, VLDL, LDLCALC, LDLDIRECT   Risk Assessment/Calculations:     Physical Exam:    VS:  BP 114/78 (BP Location: Left Arm)    Pulse (!) 57    Ht 5\' 1"  (1.549 m)    Wt 126 lb 6.4 oz (57.3 kg)    LMP  (LMP Unknown)    SpO2 96%    BMI 23.88 kg/m     Wt Readings from Last 3 Encounters:  06/19/21 126 lb 6.4 oz (57.3 kg)  04/17/21 129 lb (58.5 kg)  04/09/21 127 lb (57.6 kg)     GEN:  Well nourished, well developed in no acute distress HEENT: Normal NECK: No  JVD; No carotid bruits CARDIAC: RRR, no murmurs, rubs, gallops RESPIRATORY:  Clear to auscultation without rales, wheezing or rhonchi  ABDOMEN: Soft, non-tender, non-distended MUSCULOSKELETAL:  No edema; No deformity. 2+ pedal pulses, equal bilaterally SKIN: Warm and dry NEUROLOGIC:  Alert and oriented x 3 PSYCHIATRIC:  Normal affect   EKG:  EKG is not ordered today.    Diagnoses:    1. Atypical chest pain   2. Bradycardia    Assessment and Plan:     Atypical chest pain: She is not having any chest pain today and reports no problems with chest pain recently.  No evidence for further ischemia evaluation at this time. I encouraged her and her son to call back if she develops any chest pain and advised that there are initial noninvasive tests that we can do should she did develop chest pain again  Bradycardia: Heart rate is 57 today.  She denies dizziness, lightheadedness, presyncope, syncope. She does not measure this at home.  She does not follow a formal exercise program but frequently goes on walks with her family. She is not on any medication.  No indication for further testing.  Disposition: Follow-up as needed   Medication Adjustments/Labs and Tests Ordered: Current medicines are reviewed at length with the patient today.  Concerns regarding medicines are outlined above.  No orders of the defined types were placed in this encounter.  No orders of the defined types were placed in this encounter.   Patient Instructions  Medication Instructions:  No changes at this time.   *If you need a refill on your cardiac medications before your next appointment, please call your pharmacy*   Lab Work: None  If you have labs (blood work) drawn today and your tests are completely normal, you will receive your results only by: MyChart Message (if you have MyChart) OR A paper copy in the mail If you have any lab test that is abnormal or we need to change your treatment, we will call  you to review the results.   Testing/Procedures: None   Follow-Up: At [facility], you and your health needs are our priority.  As part of our continuing mission to provide you with exceptional heart care, we have created designated Provider Care Teams.  These Care Teams include your primary Cardiologist (physician) and Advanced Practice Providers (APPs -  Physician Assistants and Nurse Practitioners) who all work together to provide you with the care you need, when you need it.  We recommend signing up for the patient portal called "MyChart".  Sign up information is provided on this After Visit Summary.  MyChart is used to connect with patients for Virtual Visits (Telemedicine).  Patients  are able to view lab/test results, encounter notes, upcoming appointments, etc.  Non-urgent messages can be sent to your provider as well.   To learn more about what you can do with MyChart, go to [email or web address].    Your next appointment:   As needed   Signed, Levi AlandSwinyer, Elina Streng M, NP  06/19/2021 4:18 PM    [facility]

## 2021-06-19 NOTE — Patient Instructions (Signed)
Medication Instructions:  No changes at this time.   *If you need a refill on your cardiac medications before your next appointment, please call your pharmacy*   Lab Work: None  If you have labs (blood work) drawn today and your tests are completely normal, you will receive your results only by: MyChart Message (if you have MyChart) OR A paper copy in the mail If you have any lab test that is abnormal or we need to change your treatment, we will call you to review the results.   Testing/Procedures: None   Follow-Up: At [facility], you and your health needs are our priority.  As part of our continuing mission to provide you with exceptional heart care, we have created designated Provider Care Teams.  These Care Teams include your primary Cardiologist (physician) and Advanced Practice Providers (APPs -  Physician Assistants and Nurse Practitioners) who all work together to provide you with the care you need, when you need it.  We recommend signing up for the patient portal called "MyChart".  Sign up information is provided on this After Visit Summary.  MyChart is used to connect with patients for Virtual Visits (Telemedicine).  Patients are able to view lab/test results, encounter notes, upcoming appointments, etc.  Non-urgent messages can be sent to your provider as well.   To learn more about what you can do with MyChart, go to [email or web address].    Your next appointment:   As needed

## 2021-07-17 ENCOUNTER — Ambulatory Visit: Payer: Self-pay | Admitting: Internal Medicine

## 2021-07-17 NOTE — Progress Notes (Unsigned)
° ° °  GYNECOLOGY PROGRESS NOTE  Subjective:    Patient ID: April Mosley, female    DOB: April 07, 1966, 56 y.o.   MRN: [id number]  HPI  Patient is a 56 y.o. No obstetric history on file. female who presents for referral for cervical cancer screening.    {Common ambulatory SmartLinks:19316}  Review of Systems {ros; complete:30496}   Objective:   There were no vitals taken for this visit. There is no height or weight on file to calculate BMI. General appearance: {general exam:16600} Abdomen: {abdominal exam:16834} Pelvic: {pelvic exam:16852::"cervix normal in appearance","external genitalia normal","no adnexal masses or tenderness","no cervical motion tenderness","rectovaginal septum normal","uterus normal size, shape, and consistency","vagina normal without discharge"} Extremities: {extremity exam:5109} Neurologic: {neuro exam:17854}   Assessment:   No diagnosis found.   Plan:   There are no diagnoses linked to this encounter.

## 2021-07-20 ENCOUNTER — Ambulatory Visit: Payer: Self-pay | Admitting: Internal Medicine

## 2021-07-22 ENCOUNTER — Encounter: Payer: Self-pay | Admitting: Obstetrics and Gynecology

## 2021-07-22 NOTE — Progress Notes (Deleted)
Office Visit Note  Patient: April Mosley             Date of Birth: 02/10/1966           MRN: [id number]             PCP: Charlynne Cousins, MD Referring: Charlynne Cousins, MD Visit Date: 07/23/2021 Occupation: @GUAROCC @  Subjective:  No chief complaint on file.   History of Present Illness: Irish Kupka is a 56 y.o. female here for positive ANA with bilateral hand and wrist pain. Other labs for RA were negative. Xrays of both hands did not show significant arthritis changes.***   Activities of Daily Living:  Patient reports morning stiffness for *** {minute/hour:19697}.   Patient {ACTIONS;DENIES/REPORTS:21021675::"Denies"} nocturnal pain.  Difficulty dressing/grooming: {ACTIONS;DENIES/REPORTS:21021675::"Denies"} Difficulty climbing stairs: {ACTIONS;DENIES/REPORTS:21021675::"Denies"} Difficulty getting out of chair: {ACTIONS;DENIES/REPORTS:21021675::"Denies"} Difficulty using hands for taps, buttons, cutlery, and/or writing: {ACTIONS;DENIES/REPORTS:21021675::"Denies"}  No Rheumatology ROS completed.   PMFS History:  Patient Active Problem List   Diagnosis Date Noted   ANA positive 04/17/2021   Palpitations 04/10/2021   Arthritis 04/02/2021   Gastroesophageal reflux disease with esophagitis 04/02/2021   Screen for colon cancer 04/02/2021   Encounter for screening mammogram for malignant neoplasm of breast 04/02/2021   Screening for cervical cancer 04/02/2021   Screening for osteoporosis 04/02/2021   Screening for lipid disorders 04/02/2021   Bradycardia 04/02/2021   Chest pain 04/02/2021    Past Medical History:  Diagnosis Date   GERD (gastroesophageal reflux disease)    Scoliosis deformity of spine     Family History  Problem Relation Age of Onset   Heart disease Neg Hx    Past Surgical History:  Procedure Laterality Date   CYST EXCISION Right    beneath right axilla   Social History   Social History Narrative   Not on file   Immunization History  Administered  Date(s) Administered   MODERNA COVID-19 SARS-COV-2 PEDS BIVALENT BOOSTER 6Y-11Y 07/29/2020   Yellow Fever 07/29/2020     Objective: Vital Signs: LMP  (LMP Unknown)    Physical Exam   Musculoskeletal Exam: ***  CDAI Exam: CDAI Score: -- Patient Global: --; Provider Global: -- Swollen: --; Tender: -- Joint Exam 07/23/2021   No joint exam has been documented for this visit   There is currently no information documented on the homunculus. Go to the Rheumatology activity and complete the homunculus joint exam.  Investigation: No additional findings.  Imaging: No results found.  Recent Labs: Lab Results  Component Value Date   WBC 5.1 04/02/2021   HGB 13.4 04/02/2021   PLT 234 04/02/2021   NA 139 04/02/2021   K 4.3 04/02/2021   CL 98 04/02/2021   CO2 21 04/02/2021   GLUCOSE 72 04/02/2021   BUN 16 04/02/2021   CREATININE 0.50 (L) 04/02/2021   BILITOT 0.6 04/02/2021   ALKPHOS 90 04/02/2021   AST 23 04/02/2021   ALT 16 04/02/2021   PROT 7.9 04/02/2021   ALBUMIN 4.7 04/02/2021   CALCIUM 9.9 04/02/2021    Speciality Comments: No specialty comments available.  Procedures:  No procedures performed Allergies: Patient has no known allergies.   Assessment / Plan:     Visit Diagnoses: No diagnosis found.  Orders: No orders of the defined types were placed in this encounter.  No orders of the defined types were placed in this encounter.   Face-to-face time spent with patient was *** minutes. Greater than 50% of time was spent in counseling and coordination of care.  Follow-Up Instructions: No follow-ups on file.   Collier Salina, MD  Note - This record has been created using [employer].  Chart creation errors have been sought, but may not always  have been located. Such creation errors do not reflect on  the standard of medical care.

## 2021-07-23 ENCOUNTER — Ambulatory Visit: Payer: Self-pay | Admitting: Internal Medicine

## 2021-08-10 ENCOUNTER — Ambulatory Visit: Payer: Self-pay | Admitting: Internal Medicine

## 2021-11-03 NOTE — Progress Notes (Unsigned)
ANNUAL PREVENTATIVE CARE GYNECOLOGY  ENCOUNTER NOTE  Subjective:       April Mosley is a 56 y.o. No obstetric history on file. female here for a routine annual gynecologic exam. The patient {is/is not/has never been:13135} sexually active. The patient {is/is not:13135} taking hormone replacement therapy. {post-men bleed:13152::"Patient denies post-menopausal vaginal bleeding."} The patient wears seatbelts: {yes/no:311178}. The patient participates in regular exercise: {yes/no/not asked:9010}. Has the patient ever been transfused or tattooed?: {yes/no/not asked:9010}. The patient reports that there {is/is not:9024} domestic violence in her life.  Current complaints: 1.  ***    Gynecologic History No LMP recorded (lmp unknown). Patient is postmenopausal. Contraception: {method:5051} Last Pap: ***. Results were: {norm/abn:16337} Last mammogram: ***. Results were: {norm/abn:16337} Last Colonoscopy:  Last Dexa Scan:    Obstetric History OB History  No obstetric history on file.    Past Medical History:  Diagnosis Date   GERD (gastroesophageal reflux disease)    Scoliosis deformity of spine     Family History  Problem Relation Age of Onset   Heart disease Neg Hx     Past Surgical History:  Procedure Laterality Date   CYST EXCISION Right    beneath right axilla    Social History   Socioeconomic History   Marital status: Single    Spouse name: Not on file   Number of children: Not on file   Years of education: Not on file   Highest education level: Not on file  Occupational History   Not on file  Tobacco Use   Smoking status: Never   Smokeless tobacco: Never  Vaping Use   Vaping Use: Never used  Substance and Sexual Activity   Alcohol use: Not Currently   Drug use: Never   Sexual activity: Not Currently  Other Topics Concern   Not on file  Social History Narrative   Not on file   Social Determinants of Health   Financial Resource Strain: Not on file   Food Insecurity: Not on file  Transportation Needs: Not on file  Physical Activity: Not on file  Stress: Not on file  Social Connections: Not on file  Intimate Partner Violence: Not on file    No current outpatient medications on file prior to visit.   No current facility-administered medications on file prior to visit.    No Known Allergies    Review of Systems ROS Review of Systems - General ROS: negative for - chills, fatigue, fever, hot flashes, night sweats, weight gain or weight loss Psychological ROS: negative for - anxiety, decreased libido, depression, mood swings, physical abuse or sexual abuse Ophthalmic ROS: negative for - blurry vision, eye pain or loss of vision ENT ROS: negative for - headaches, hearing change, visual changes or vocal changes Allergy and Immunology ROS: negative for - hives, itchy/watery eyes or seasonal allergies Hematological and Lymphatic ROS: negative for - bleeding problems, bruising, swollen lymph nodes or weight loss Endocrine ROS: negative for - galactorrhea, hair pattern changes, hot flashes, malaise/lethargy, mood swings, palpitations, polydipsia/polyuria, skin changes, temperature intolerance or unexpected weight changes Breast ROS: negative for - new or changing breast lumps or nipple discharge Respiratory ROS: negative for - cough or shortness of breath Cardiovascular ROS: negative for - chest pain, irregular heartbeat, palpitations or shortness of breath Gastrointestinal ROS: no abdominal pain, change in bowel habits, or black or bloody stools Genito-Urinary ROS: no dysuria, trouble voiding, or hematuria Musculoskeletal ROS: negative for - joint pain or joint stiffness Neurological ROS: negative for - bowel  and bladder control changes Dermatological ROS: negative for rash and skin lesion changes   Objective:   LMP  (LMP Unknown)  CONSTITUTIONAL: Well-developed, well-nourished female in no acute distress.  PSYCHIATRIC: Normal mood  and affect. Normal behavior. Normal judgment and thought content. NEUROLGIC: Alert and oriented to person, place, and time. Normal muscle tone coordination. No cranial nerve deficit noted. HENT:  Normocephalic, atraumatic, External right and left ear normal. Oropharynx is clear and moist EYES: Conjunctivae and EOM are normal. Pupils are equal, round, and reactive to light. No scleral icterus.  NECK: Normal range of motion, supple, no masses.  Normal thyroid.  SKIN: Skin is warm and dry. No rash noted. Not diaphoretic. No erythema. No pallor. CARDIOVASCULAR: Normal heart rate noted, regular rhythm, no murmur. RESPIRATORY: Clear to auscultation bilaterally. Effort and breath sounds normal, no problems with respiration noted. BREASTS: Symmetric in size. No masses, skin changes, nipple drainage, or lymphadenopathy. ABDOMEN: Soft, normal bowel sounds, no distention noted.  No tenderness, rebound or guarding.  BLADDER: Normal PELVIC:  Bladder {:311640}  Urethra: {:311719}  Vulva: {:311722}  Vagina: {:311643}  Cervix: {:311644}  Uterus: {:311718}  Adnexa: {:311645}  RV: {Blank multiple:19196::"External Exam NormaI","No Rectal Masses","Normal Sphincter tone"}  MUSCULOSKELETAL: Normal range of motion. No tenderness.  No cyanosis, clubbing, or edema.  2+ distal pulses. LYMPHATIC: No Axillary, Supraclavicular, or Inguinal Adenopathy.   Labs: Lab Results  Component Value Date   WBC 5.1 04/02/2021   HGB 13.4 04/02/2021   HCT 41.9 04/02/2021   MCV 87 04/02/2021   PLT 234 04/02/2021    Lab Results  Component Value Date   CREATININE 0.50 (L) 04/02/2021   BUN 16 04/02/2021   NA 139 04/02/2021   K 4.3 04/02/2021   CL 98 04/02/2021   CO2 21 04/02/2021    Lab Results  Component Value Date   ALT 16 04/02/2021   AST 23 04/02/2021   ALKPHOS 90 04/02/2021   BILITOT 0.6 04/02/2021    No results found for: CHOL, HDL, LDLCALC, LDLDIRECT, TRIG, CHOLHDL  Lab Results  Component Value Date    TSH 1.350 04/02/2021    No results found for: HGBA1C   Assessment:   No diagnosis found.   Plan:  Pap: {Blank multiple:19196::"Pap, Reflex if ASCUS","Pap Co Test","GC/CT NAAT","Not needed","Not done"} Mammogram: {Blank multiple:19196::"***","Ordered","Not Ordered","Not Indicated"} Colon Screening:  {Blank multiple:19196::"***","Ordered","Not Ordered","Not Indicated"} Labs: {Blank multiple:19196::"Lipid 1","FBS","TSH","Hemoglobin A1C","Vit D Level""***"} Routine preventative health maintenance measures emphasized: {Blank multiple:19196::"Exercise/Diet/Weight control","Tobacco Warnings","Alcohol/Substance use risks","Stress Management","Peer Pressure Issues","Safe Sex"} COVID Vaccination status: Return to Clinic - 1 Year   Hildred Laser, MD [facility]

## 2021-11-04 ENCOUNTER — Ambulatory Visit (INDEPENDENT_AMBULATORY_CARE_PROVIDER_SITE_OTHER): Payer: Self-pay | Admitting: Obstetrics and Gynecology

## 2021-11-04 ENCOUNTER — Encounter: Payer: Self-pay | Admitting: Obstetrics and Gynecology

## 2021-11-04 VITALS — BP 120/70 | HR 60 | Resp 16 | Ht 61.0 in | Wt 131.7 lb

## 2021-11-04 DIAGNOSIS — N951 Menopausal and female climacteric states: Secondary | ICD-10-CM

## 2021-11-04 DIAGNOSIS — Z124 Encounter for screening for malignant neoplasm of cervix: Secondary | ICD-10-CM

## 2021-11-04 NOTE — Patient Instructions (Signed)

## 2021-11-12 LAB — IGP, COBASHPV16/18
HPV 16: NEGATIVE
HPV 18: NEGATIVE
HPV other hr types: NEGATIVE

## 2022-10-14 IMAGING — DX DG HAND COMPLETE 3+V*R*
3 series · 3 of 3 positions shown · non-contrast
Comparison: None.

CLINICAL DATA: Chronic bilateral hand pain without known injury.

EXAM:
RIGHT HAND - COMPLETE 3+ VIEW

[hand ap]
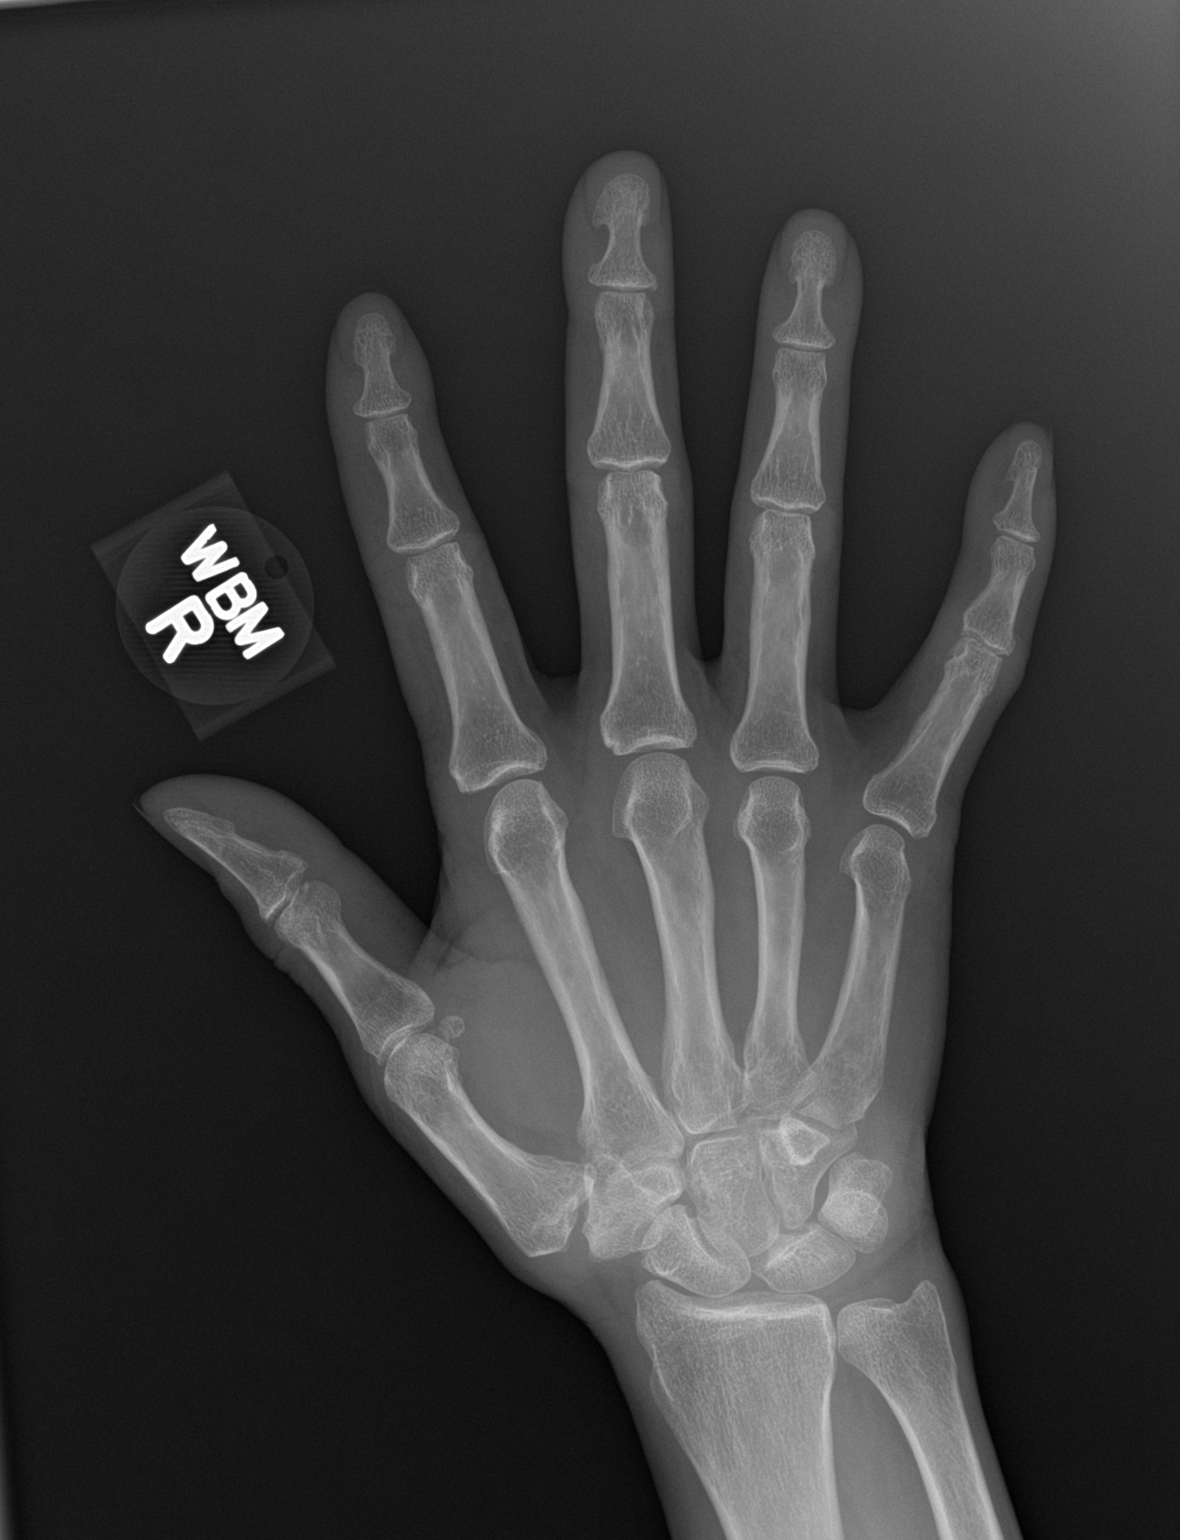

[hand obl]
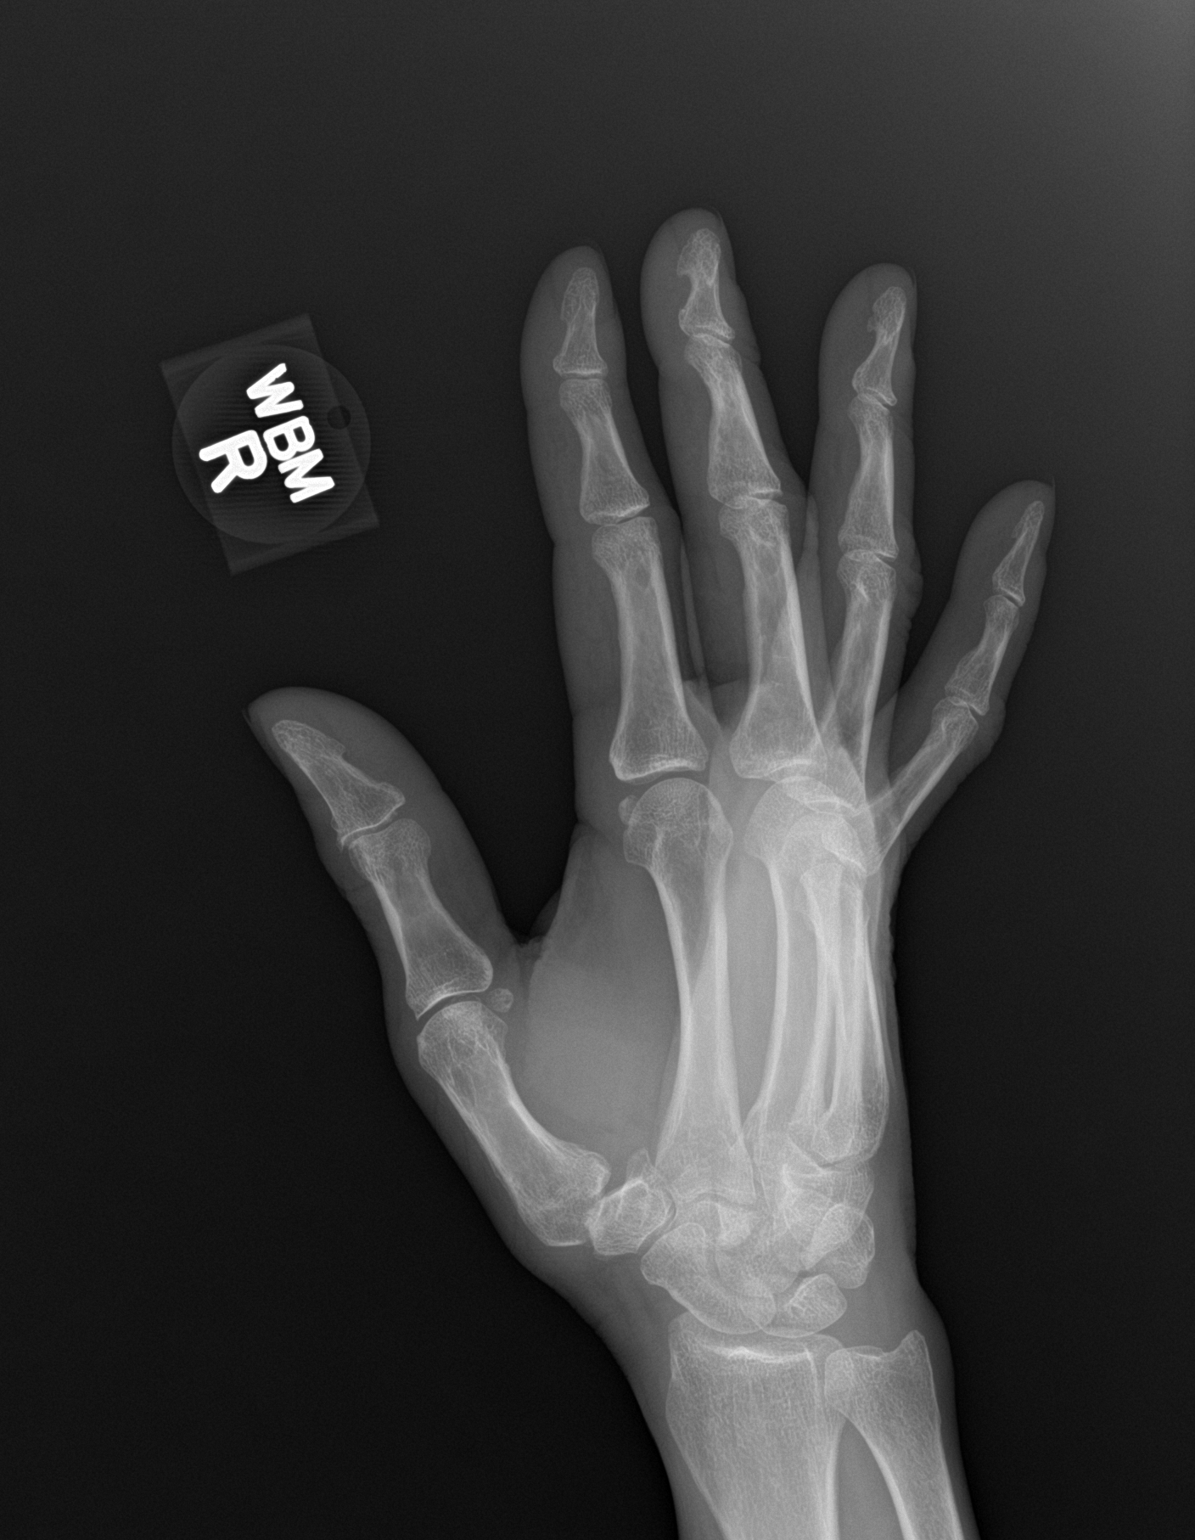

[hand lat]
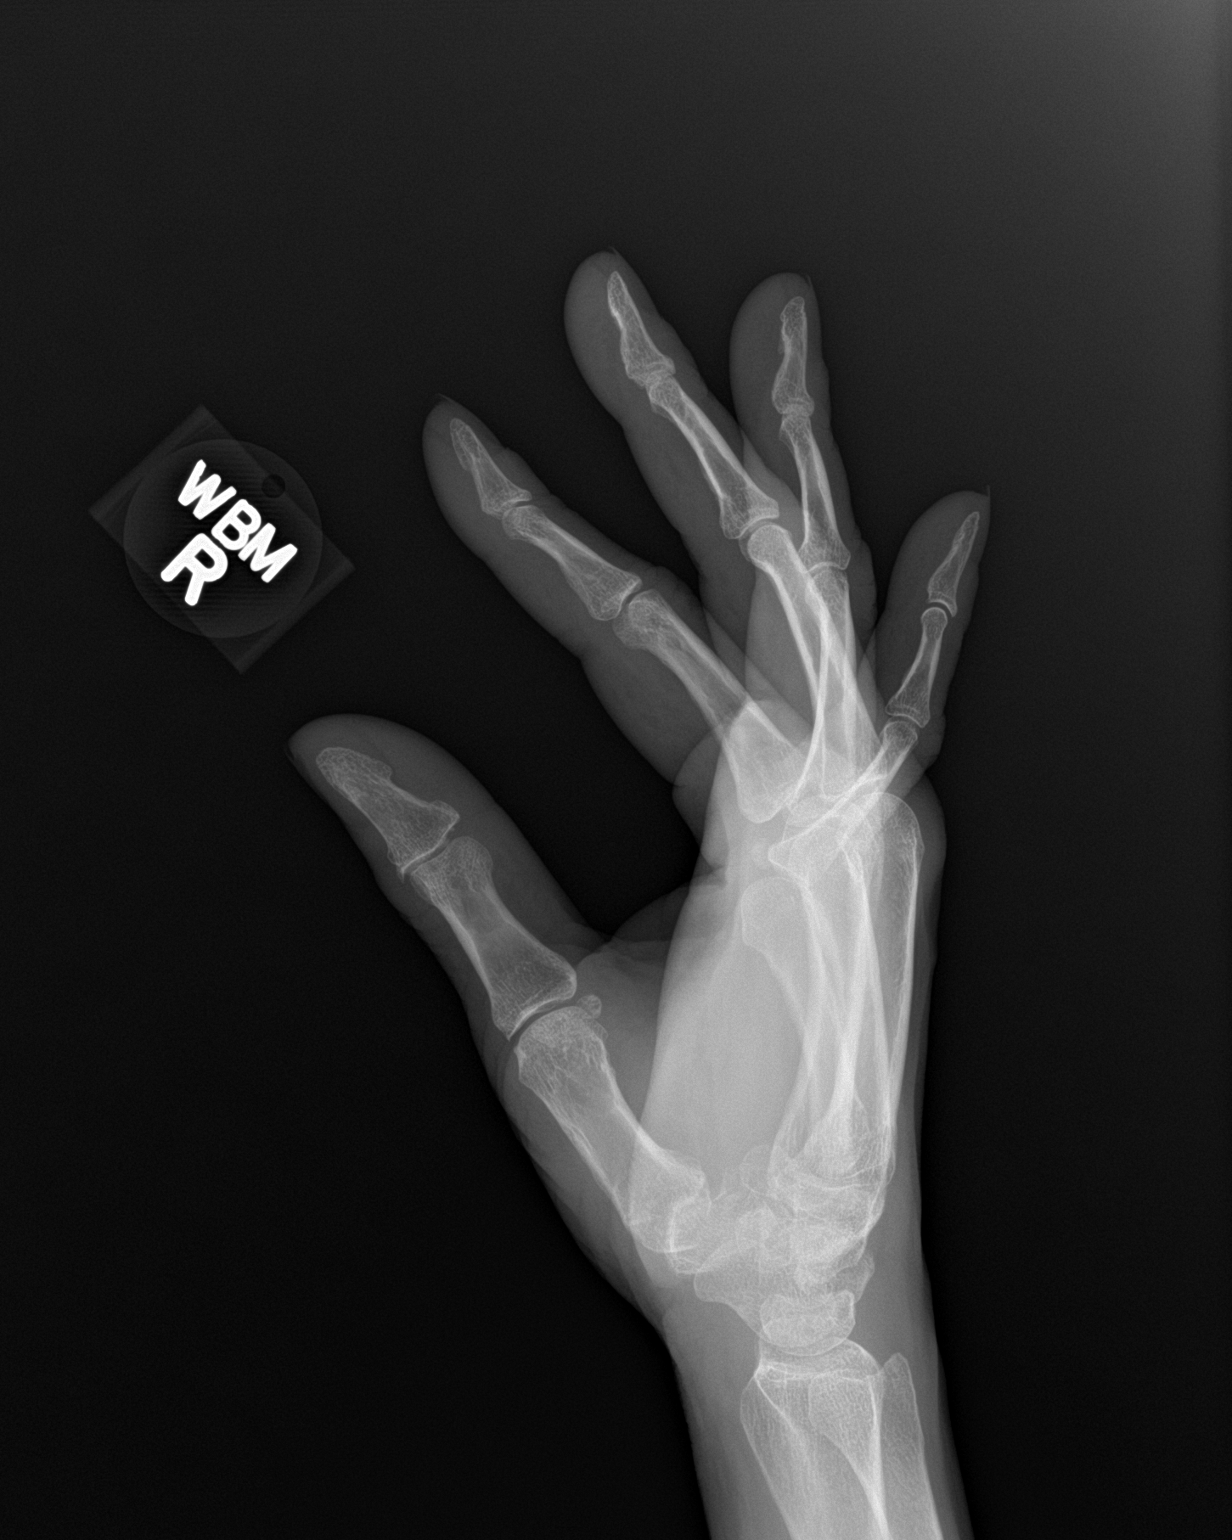

[3 of 3 positions shown; findings below may reference images not displayed]

FINDINGS: There is no evidence of fracture or dislocation. There is no
evidence of arthropathy or other focal bone abnormality. Soft
tissues are unremarkable.
IMPRESSION: Negative.

## 2022-10-14 IMAGING — DX DG HAND COMPLETE 3+V*L*
3 series · 3 of 3 positions shown · non-contrast
Comparison: None.

CLINICAL DATA: Chronic bilateral hand pain.  No known injury

EXAM:
LEFT HAND - COMPLETE 3+ VIEW

[hand ap]
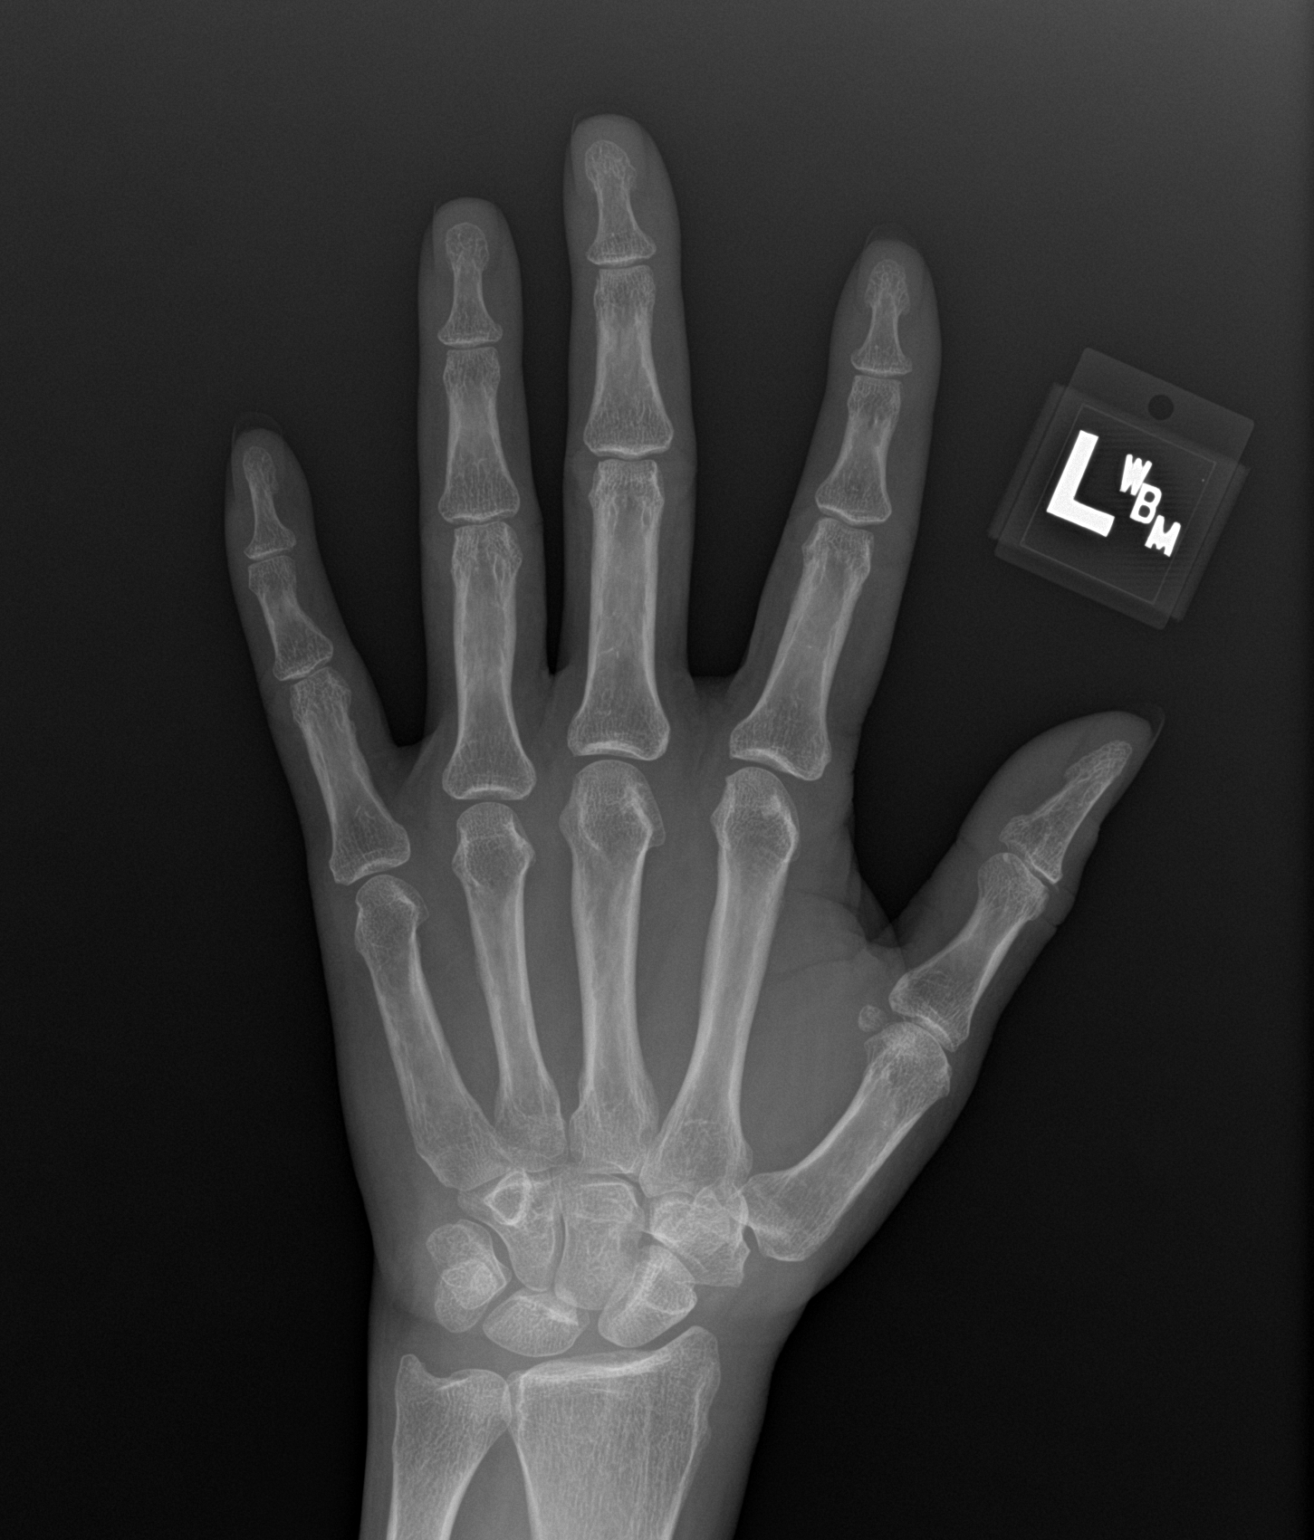

[hand obl]
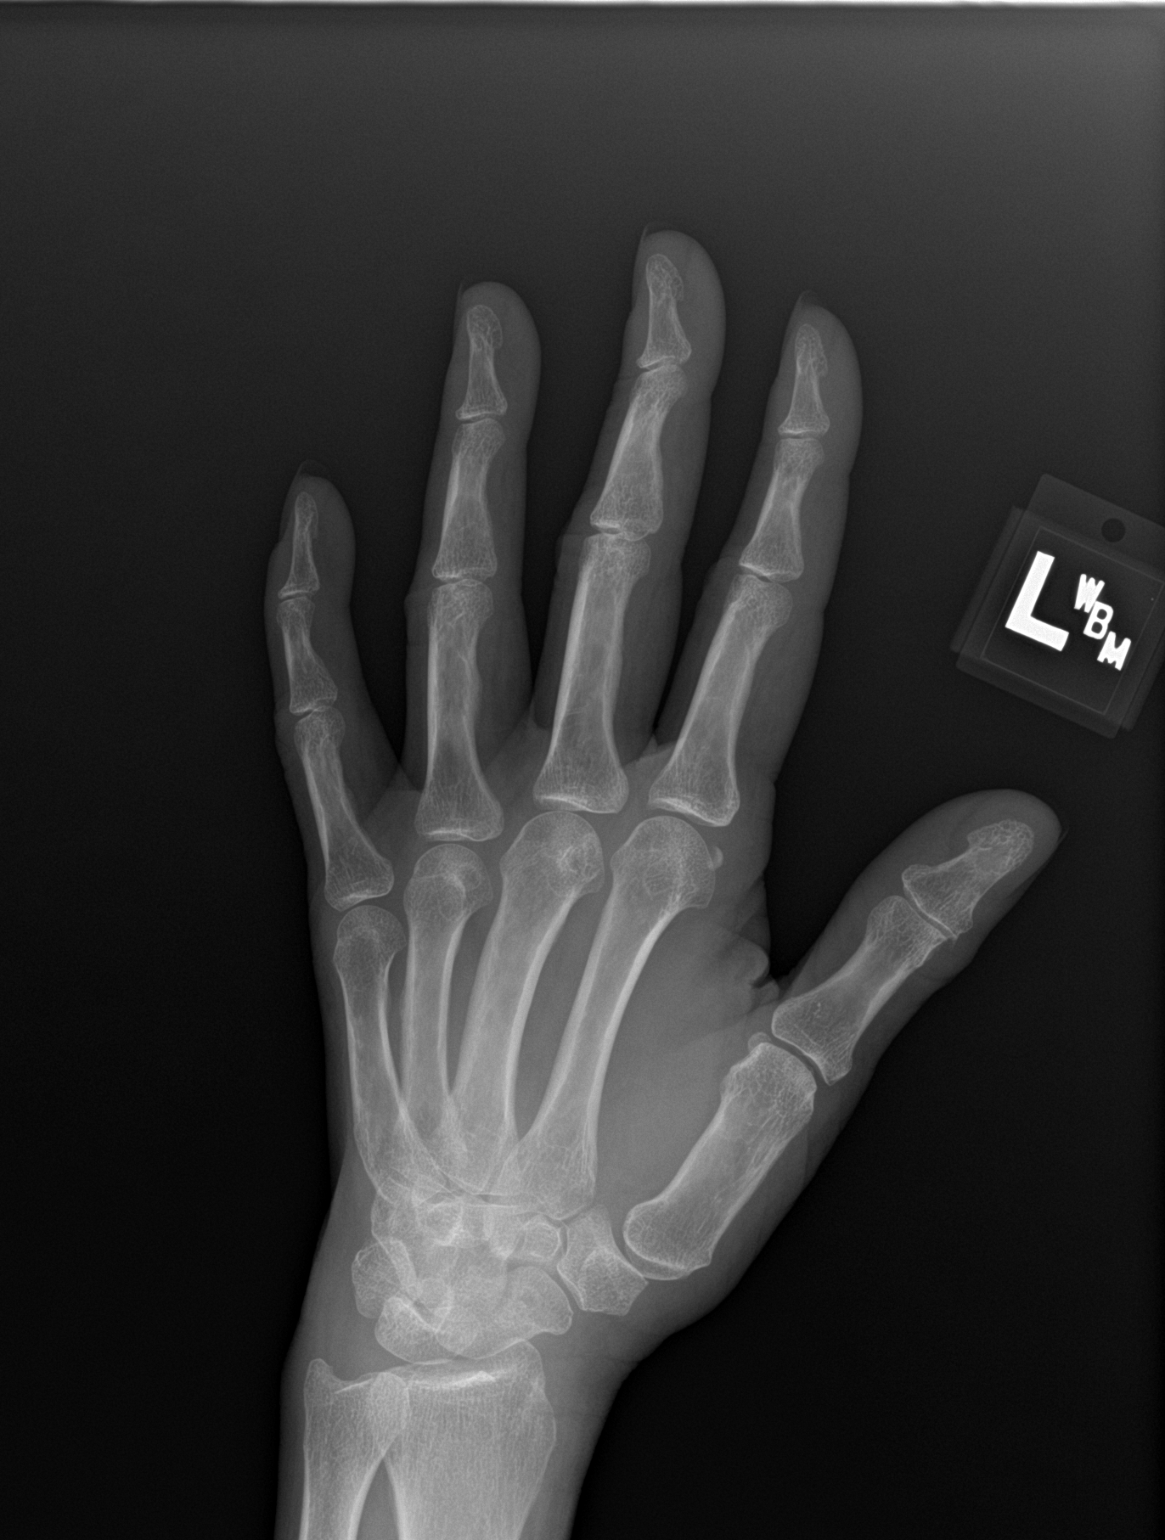

[hand lat]
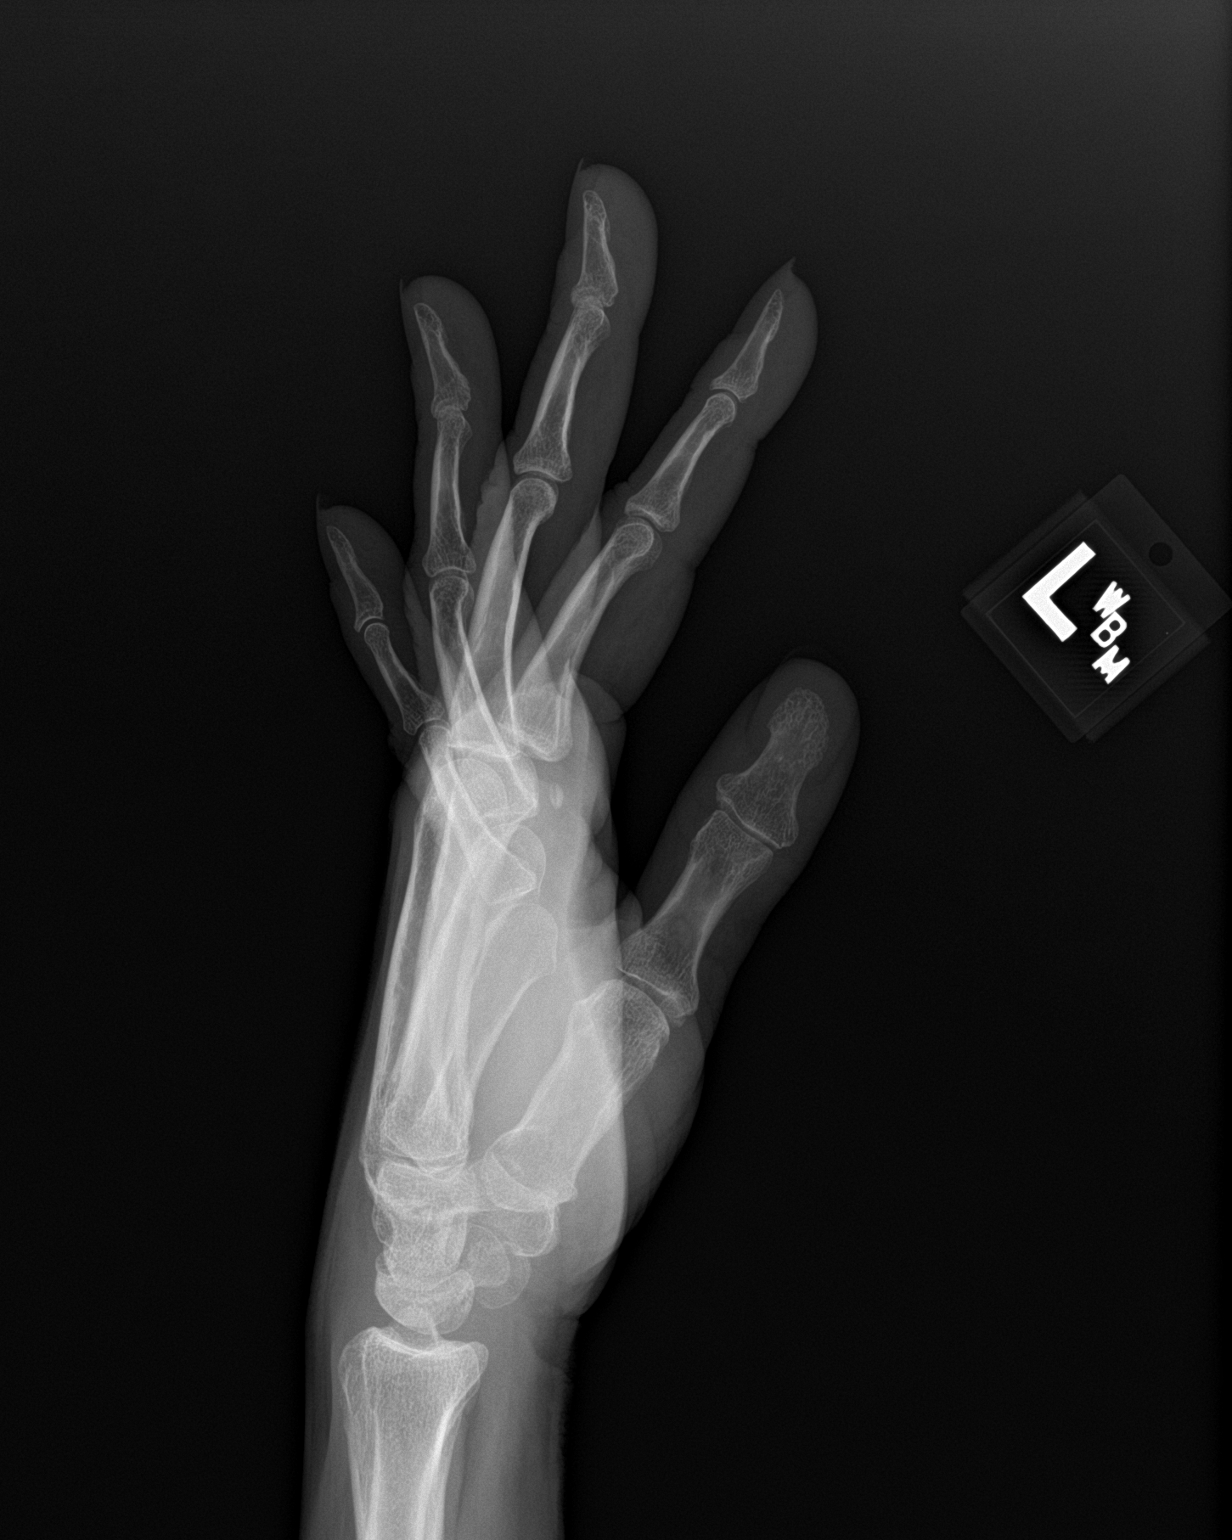

[3 of 3 positions shown; findings below may reference images not displayed]

FINDINGS: There is no evidence of fracture or dislocation. There is no
evidence of arthropathy or other focal bone abnormality. Soft
tissues are unremarkable.
IMPRESSION: Negative.
